# Patient Record
Sex: Male | Born: 1980 | Race: White | Hispanic: No | Marital: Married | Smoking: Former smoker
Health system: Southern US, Community
[De-identification: ages and names within clinical notes are randomized; demographics above are authoritative.]

---

## 2017-02-28 ENCOUNTER — Encounter: Payer: Self-pay | Admitting: Emergency Medicine

## 2017-02-28 ENCOUNTER — Emergency Department (INDEPENDENT_AMBULATORY_CARE_PROVIDER_SITE_OTHER): Payer: 59

## 2017-02-28 ENCOUNTER — Emergency Department (INDEPENDENT_AMBULATORY_CARE_PROVIDER_SITE_OTHER)
Admission: EM | Admit: 2017-02-28 | Discharge: 2017-02-28 | Disposition: A | Discharge status: Home / Self Care | Payer: 59 | Source: Home / Self Care | Attending: Family Medicine | Admitting: Family Medicine

## 2017-02-28 DIAGNOSIS — M7050 Other bursitis of knee, unspecified knee: Secondary | ICD-10-CM

## 2017-02-28 DIAGNOSIS — M222X1 Patellofemoral disorders, right knee: Secondary | ICD-10-CM | POA: Diagnosis not present

## 2017-02-28 DIAGNOSIS — M25461 Effusion, right knee: Secondary | ICD-10-CM | POA: Diagnosis not present

## 2017-02-28 MED ORDER — PREDNISONE 20 MG PO TABS: ORAL_TABLET | ORAL | 0 refills | Status: DC

## 2017-02-28 NOTE — ED Triage Notes (Signed)
Pt c/o right knee pain and swelling that started x2 weeks ago, denies injury but states yesterday pain worsened. He used ice and prednisone.

## 2017-02-28 NOTE — ED Provider Notes (Signed)
[facility]    CSN: [id number] Arrival date & time: 02/28/17  1230     History   Chief Complaint Chief Complaint  Patient presents with  . Knee Pain    HPI Anders SimmondsBenjamin J Koppenhaver is a 36 y.o. male.   Patient complains of persistent pain in his right knee for three weeks.  He is quite physically active but recalls no injury or change in physical activities.  He has been applying ice alternating with heat, doing stretching exercises, and took ibuprofen initially.  He then took a tapering course of prednisone with improvement.  No locking of knee or sensation of giving way.   The history is provided by the patient.  Knee Pain  Location:  Knee Time since incident:  3 weeks Injury: no   Knee location:  R knee Pain details:    Quality:  Aching   Radiates to:  Does not radiate   Severity:  Mild   Onset quality:  Sudden   Duration:  3 weeks   Timing:  Constant   Progression:  Improving Dislocation: no   Prior injury to area:  No Relieved by:  Nothing Worsened by:  Activity Ineffective treatments:  NSAIDs Associated symptoms: swelling   Associated symptoms: no back pain, no decreased ROM, no fatigue, no fever, no muscle weakness, no numbness, no stiffness and no tingling     History reviewed. No pertinent past medical history.  There are no active problems to display for this patient.   History reviewed. No pertinent surgical history.     Home Medications    Prior to Admission medications   Not on File    Family History History reviewed. No pertinent family history.  Social History Social History   Tobacco Use  . Smoking status: Never Smoker  . Smokeless tobacco: Never Used  Substance Use Topics  . Alcohol use: No    Frequency: Never  . Drug use: Not on file     Allergies   Patient has no allergy information on record.   Review of Systems Review of Systems  Constitutional: Negative for fatigue and fever.  Musculoskeletal: Negative  for back pain and stiffness.  All other systems reviewed and are negative.    Physical Exam Triage Vital Signs ED Triage Vitals  Enc Vitals Group     BP 02/28/17 1253 136/72     Pulse Rate 02/28/17 1253 66     Resp --      Temp 02/28/17 1253 97.8 F (36.6 C)     Temp Source 02/28/17 1253 Oral     SpO2 02/28/17 1253 98 %     Weight 02/28/17 1254 156 lb (70.8 kg)     Height --      Head Circumference --      Peak Flow --      Pain Score 02/28/17 1254 3     Pain Loc --      Pain Edu? --      Excl. in GC? --    No data found.  Updated Vital Signs BP 136/72 (BP Location: Right Arm)   Pulse 66   Temp 97.8 F (36.6 C) (Oral)   Wt 156 lb (70.8 kg)   SpO2 98%   Visual Acuity Right Eye Distance:   Left Eye Distance:   Bilateral Distance:    Right Eye Near:   Left Eye Near:    Bilateral Near:     Physical Exam  Constitutional: He appears well-developed and  well-nourished. No distress.  HENT:  Head: Normocephalic.  Eyes: Pupils are equal, round, and reactive to light.  Cardiovascular: Normal rate.  Pulmonary/Chest: Effort normal.  Musculoskeletal: He exhibits no edema.       Right knee: He exhibits normal range of motion, no swelling, no effusion, no ecchymosis, no deformity, no laceration, no erythema, normal alignment, no LCL laxity and normal meniscus. Tenderness found. No medial joint line and no lateral joint line tenderness noted.       Legs: Right knee:  No effusion, erythema, or warmth.  Knee stable, negative drawer test.  McMurray test negative.  Mild tenderness to palpation over pes anserine bursa.  Tenderness over inferior/medial edge of patella.   Neurological: He is alert.  Skin: Skin is warm and dry.  Nursing note and vitals reviewed.    UC Treatments / Results  Labs (all labs ordered are listed, but only abnormal results are displayed) Labs Reviewed - No data to display  EKG  EKG Interpretation None       Radiology Dg Knee Complete 4  Views Right  Result Date: 02/28/2017 CLINICAL DATA:  Pain for 3 weeks.  No injury is reported. EXAM: RIGHT KNEE - COMPLETE 4+ VIEW COMPARISON:  None. FINDINGS: No evidence of fracture, or dislocation. There is a small joint effusion. No evidence of arthropathy or other focal bone abnormality. Soft tissues are unremarkable. IMPRESSION: Small joint effusion. No fracture or significant joint space narrowing. Electronically Signed   By: Elsie StainJohn T Curnes M.D.   On: 02/28/2017 13:39    Procedures Procedures (including critical care time)  Medications Ordered in UC Medications - No data to display   Initial Impression / Assessment and Plan / UC Course  I have reviewed the triage vital signs and the nursing notes.  Pertinent labs & imaging results that were available during my care of the patient were reviewed by me and considered in my medical decision making (see chart for details).    Continue ice packs until pain and swelling decrease.  Continue range of motion, strengthening, and stretching exercises.  May begin Ibuprofen 200mg , 4 tabs every 8 hours with food, after finishing prednisone. Followup with Dr. Rodney Langtonhomas Thekkekandam or Dr. Clementeen GrahamEvan Corey ([facility]) if not improving about four weeks.    Final Clinical Impressions(s) / UC Diagnoses   Final diagnoses:  Patellofemoral pain syndrome of right knee  Pes anserine bursitis    ED Discharge Orders    None          Lattie HawBeese, Maronda Caison A, MD 02/28/17 1431

## 2017-02-28 NOTE — Discharge Instructions (Signed)
Continue ice packs until pain and swelling decrease.  Continue range of motion, strengthening, and stretching exercises.  May begin Ibuprofen 200mg , 4 tabs every 8 hours with food, after finishing prednisone.

## 2018-03-26 ENCOUNTER — Encounter: Payer: Self-pay | Admitting: *Deleted

## 2018-03-26 ENCOUNTER — Emergency Department
Admission: EM | Admit: 2018-03-26 | Discharge: 2018-03-26 | Disposition: A | Discharge status: Home / Self Care | Payer: No Typology Code available for payment source | Source: Home / Self Care | Attending: Family Medicine | Admitting: Family Medicine

## 2018-03-26 ENCOUNTER — Other Ambulatory Visit: Payer: Self-pay

## 2018-03-26 DIAGNOSIS — J101 Influenza due to other identified influenza virus with other respiratory manifestations: Secondary | ICD-10-CM | POA: Diagnosis not present

## 2018-03-26 LAB — POCT INFLUENZA A/B
Influenza A, POC: POSITIVE — AB
Influenza B, POC: NEGATIVE

## 2018-03-26 MED ORDER — OSELTAMIVIR PHOSPHATE 75 MG PO CAPS: 75.0000 mg | ORAL_CAPSULE | Freq: Two times a day (BID) | ORAL | 0 refills | Status: AC

## 2018-03-26 NOTE — Discharge Instructions (Signed)
  You may take 500mg acetaminophen every 4-6 hours or in combination with ibuprofen 400-600mg every 6-8 hours as needed for pain, inflammation, and fever.  Be sure to well hydrated with clear liquids and get at least 8 hours of sleep at night, preferably more while sick.   Please follow up with family medicine in 1 week if needed.   

## 2018-03-26 NOTE — ED Provider Notes (Signed)
[facility]    CSN: [id number] Arrival date & time: 03/26/18  0840     History   Chief Complaint Chief Complaint  Patient presents with  . Generalized Body Aches  . Fatigue  . Sinus Problem    HPI Marcus [facility] is a 38 y.o. male.   HPI  Marcus Fox is a 38 y.o. male presenting to UC with c/o 2 days body aches, fatigue, sinus congestion with pressure, and sore throat. Others at work have been sick. Pt concerned he has the flu.  He has taken sudafed, mucinex and ibuprofen w/o relief. No medication taken today. He did not receive the flu vaccine. He has been able to continue eating and drinking.    History reviewed. No pertinent past medical history.  There are no active problems to display for this patient.   History reviewed. No pertinent surgical history.     Home Medications    Prior to Admission medications   Medication Sig Start Date End Date Taking? Authorizing Provider  oseltamivir (TAMIFLU) 75 MG capsule Take 1 capsule (75 mg total) by mouth every 12 (twelve) hours. 03/26/18   Lurene Shadow, PA-C    Family History History reviewed. No pertinent family history.  Social History Social History   Tobacco Use  . Smoking status: Former Games developer  . Smokeless tobacco: Never Used  Substance Use Topics  . Alcohol use: Yes    Frequency: Never  . Drug use: Never     Allergies   Patient has no known allergies.   Review of Systems Review of Systems  Constitutional: Positive for chills and fatigue. Negative for fever.  HENT: Positive for congestion. Negative for ear pain, sore throat, trouble swallowing and voice change.   Respiratory: Positive for cough. Negative for shortness of breath.   Cardiovascular: Negative for chest pain and palpitations.  Gastrointestinal: Negative for abdominal pain, diarrhea, nausea and vomiting.  Musculoskeletal: Positive for arthralgias, back pain and myalgias.  Skin: Negative for rash.  Neurological:  Positive for headaches.     Physical Exam Triage Vital Signs ED Triage Vitals [03/26/18 0907]  Enc Vitals Group     BP 109/73     Pulse Rate 93     Resp 16     Temp 98.2 F (36.8 C)     Temp Source Oral     SpO2 98 %     Weight 160 lb (72.6 kg)     Height 5\' 11"  (1.803 m)     Head Circumference      Peak Flow      Pain Score 3     Pain Loc      Pain Edu?      Excl. in GC?    No data found.  Updated Vital Signs BP 109/73 (BP Location: Right Arm)   Pulse 93   Temp 98.2 F (36.8 C) (Oral)   Resp 16   Ht 5\' 11"  (1.803 m)   Wt 160 lb (72.6 kg)   SpO2 98%   BMI 22.32 kg/m   Visual Acuity Right Eye Distance:   Left Eye Distance:   Bilateral Distance:    Right Eye Near:   Left Eye Near:    Bilateral Near:     Physical Exam Vitals signs and nursing note reviewed.  Constitutional:      Appearance: Normal appearance. He is well-developed.  HENT:     Head: Normocephalic and atraumatic.     Right Ear: Tympanic membrane  normal.     Left Ear: Tympanic membrane normal.     Nose: Nose normal.     Right Sinus: No maxillary sinus tenderness or frontal sinus tenderness.     Left Sinus: No maxillary sinus tenderness or frontal sinus tenderness.     Mouth/Throat:     Lips: Pink.     Mouth: Mucous membranes are moist.     Pharynx: Oropharynx is clear. Uvula midline.  Neck:     Musculoskeletal: Normal range of motion.  Cardiovascular:     Rate and Rhythm: Normal rate and regular rhythm.  Pulmonary:     Effort: Pulmonary effort is normal. No respiratory distress.     Breath sounds: Normal breath sounds. No stridor. No wheezing or rhonchi.  Musculoskeletal: Normal range of motion.  Skin:    General: Skin is warm and dry.  Neurological:     Mental Status: He is alert and oriented to person, place, and time.  Psychiatric:        Behavior: Behavior normal.      UC Treatments / Results  Labs (all labs ordered are listed, but only abnormal results are  displayed) Labs Reviewed  POCT INFLUENZA A/B - Abnormal; Notable for the following components:      Result Value   Influenza A, POC Positive (*)    All other components within normal limits    EKG None  Radiology No results found.  Procedures Procedures (including critical care time)  Medications Ordered in UC Medications - No data to display  Initial Impression / Assessment and Plan / UC Course  I have reviewed the triage vital signs and the nursing notes.  Pertinent labs & imaging results that were available during my care of the patient were reviewed by me and considered in my medical decision making (see chart for details).     Flu test: POSITIVE Influenza A Rx: tamiflu  F/u with PCP as needed  Final Clinical Impressions(s) / UC Diagnoses   Final diagnoses:  Influenza A     Discharge Instructions      You may take 500mg  acetaminophen every 4-6 hours or in combination with ibuprofen 400-600mg  every 6-8 hours as needed for pain, inflammation, and fever.  Be sure to well hydrated with clear liquids and get at least 8 hours of sleep at night, preferably more while sick.   Please follow up with family medicine in 1 week if needed.     ED Prescriptions    Medication Sig Dispense Auth. Provider   oseltamivir (TAMIFLU) 75 MG capsule Take 1 capsule (75 mg total) by mouth every 12 (twelve) hours. 10 capsule Lurene Shadow, PA-C     Controlled Substance Prescriptions [place] Controlled Substance Registry consulted? Not Applicable   Rolla Plate 03/26/18 1058

## 2018-03-26 NOTE — ED Triage Notes (Signed)
Patient c/o 2 days of aches, fatigue, sinus pain and pressure. OTC-sudafed, mucinex and IBF.

## 2019-07-14 IMAGING — DX DG KNEE COMPLETE 4+V*R*
4 series · 4 of 4 positions shown · non-contrast
Comparison: None.

CLINICAL DATA: Pain for 3 weeks.  No injury is reported.

EXAM:
RIGHT KNEE - COMPLETE 4+ VIEW

[knee ap]
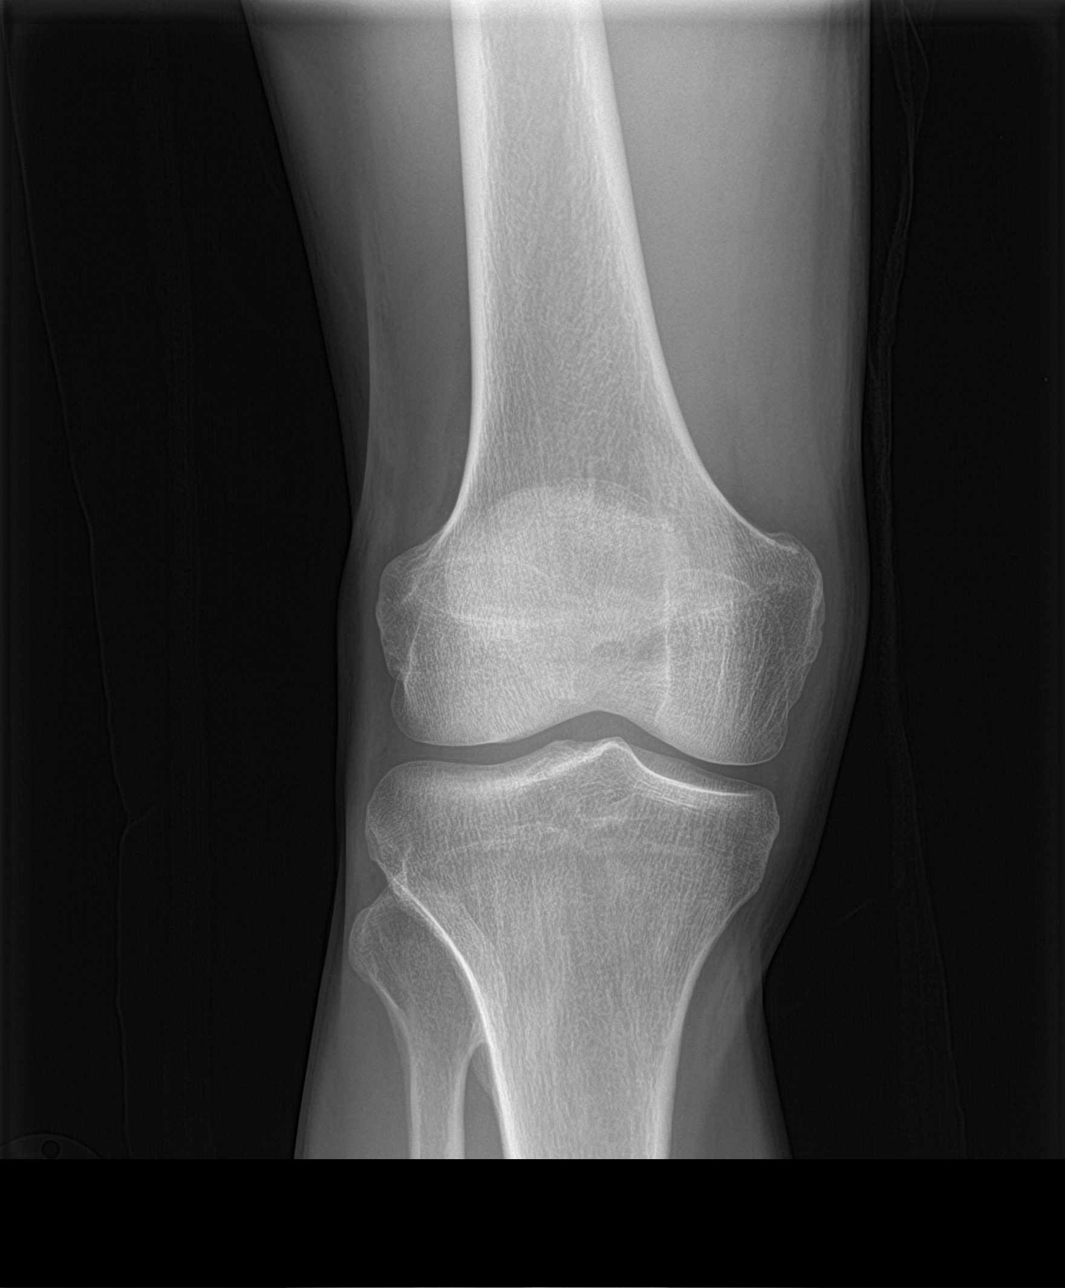

[knee lat]
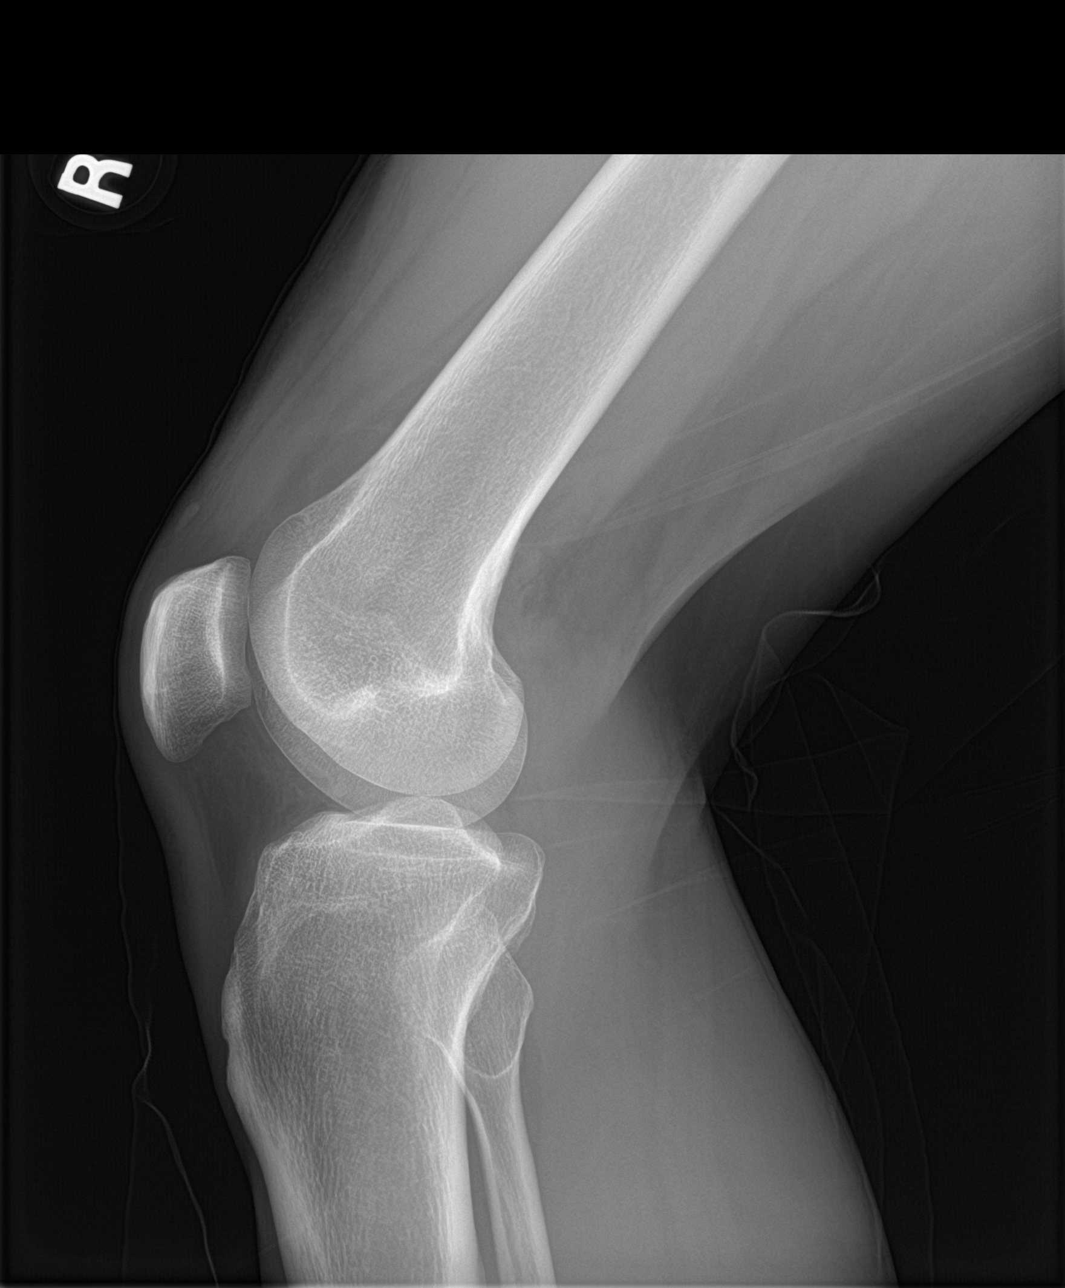

[knee obl (1 of 2)]
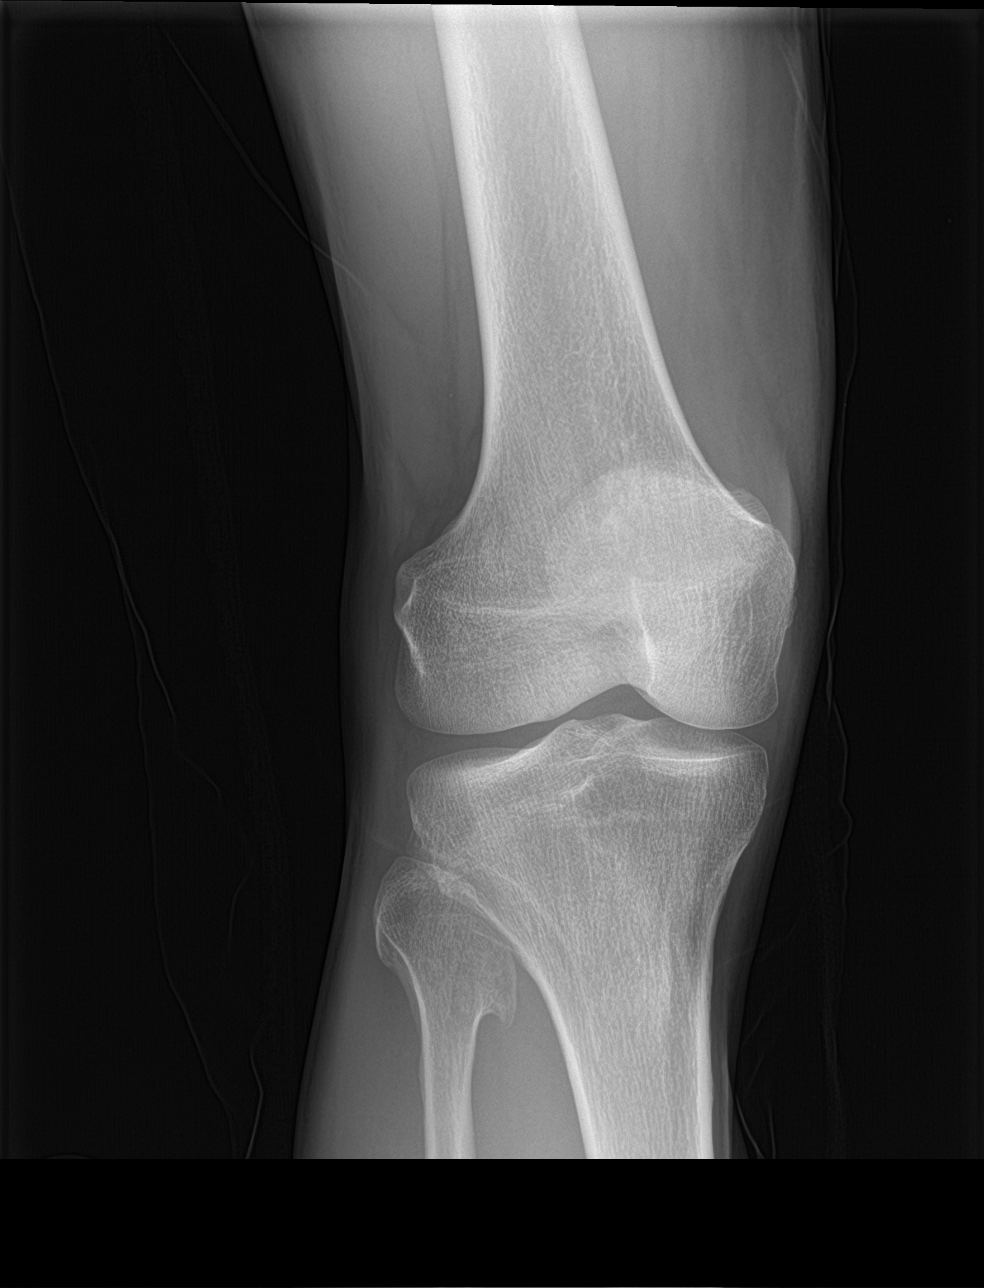

[knee obl (2 of 2)]
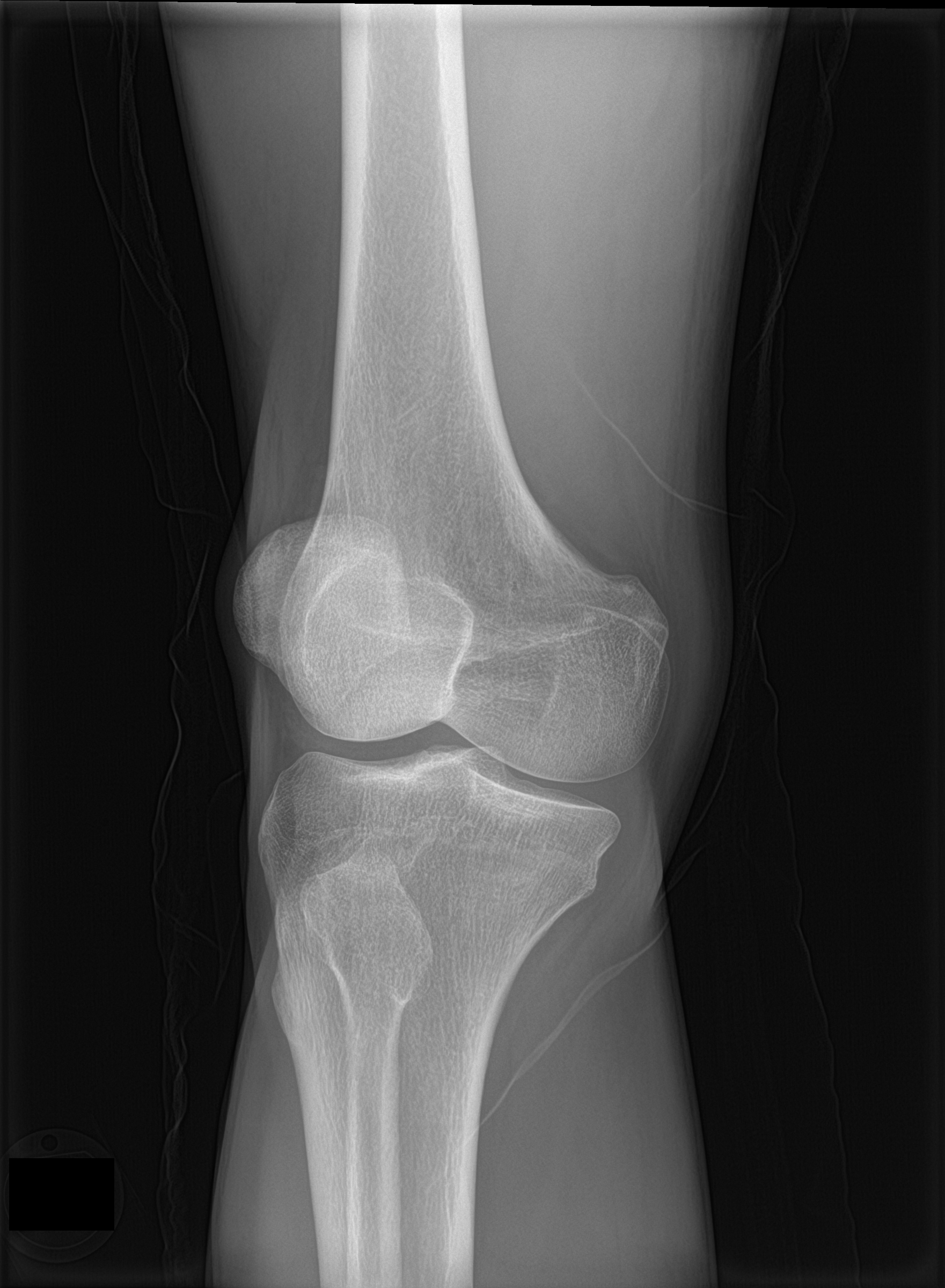

[4 of 4 positions shown; findings below may reference images not displayed]

FINDINGS: No evidence of fracture, or dislocation. There is a small joint
effusion. No evidence of arthropathy or other focal bone
abnormality. Soft tissues are unremarkable.
IMPRESSION: Small joint effusion. No fracture or significant joint space
narrowing.

## 2019-12-16 NOTE — Telephone Encounter (Signed)
Will obtain EKG from pcp

## 2019-12-17 ENCOUNTER — Ambulatory Visit
Admit: 2019-12-17 | Discharge: 2019-12-17 | Payer: PRIVATE HEALTH INSURANCE | Attending: Interventional Cardiology | Primary: Internal Medicine

## 2019-12-17 DIAGNOSIS — R9431 Abnormal electrocardiogram [ECG] [EKG]: Secondary | ICD-10-CM

## 2019-12-17 NOTE — Progress Notes (Signed)
[facility]  Cardiology New Patient Note    DATE of SERVICE:12/17/19  TIME of SERVICE: 3:16 PM                   Chief Complaint:  Chief Complaint   Patient presents with   ??? New Patient       History of PresentIllness:   Richard Copeland is a 39 y.o. male with no significant past medical history and no cardiac history who presented to his PCP for preoperative evaluation prior to bicep tendon repair surgery.  This has been scheduled for next week.  He had a preop EKG that showed some abnormalities and was referred for cardiac evaluation.  He reports no cardiac symptoms.  He denies chest pain, shortness of breath, dizziness, palpitations or syncope symptoms or presyncopal symptoms.  He is very physically active and does hiking, climbing, canoeing and reports no symptoms at all.  He has never had prior cardiac work-up.  We repeated an EKG today in the office which showed sinus rhythm and changes suggestive of early repolarization.  His blood pressure was 104/60 mmHg.  He has no family history of heart disease.  He is a former smoker and quit a year ago.    Past Medical History:  History reviewed. No pertinent past medical history.     Past Surgical History  Past Surgical History:   Procedure Laterality Date   ??? BICEPS TENODESIS         Family History  Family History   Problem Relation Age of Onset   ??? Elevated Lipids Mother         Social History  Social History     Tobacco Use   ??? Smoking status: Former Smoker   ??? Smokeless tobacco: Former Neurosurgeon     Types: Chew     Quit date: 12/08/2018   Substance Use Topics   ??? Alcohol use: Not on file     Comment: rare   ??? Drug use: Not on file        Allergies:  No Known Allergies    Medications:    Current Outpatient Medications:   ???  cetirizine (ZYRTEC) 10 MG tablet, Take 10 mg by mouth daily prn, Disp: , Rfl:     Review of Systems:  Review of Systems   Constitutional: Negative for chills, diaphoresis and fever.   HENT: Negative for nosebleeds.    Eyes:  Negative for visual disturbance.   Respiratory: Negative for cough, shortness of breath and wheezing.    Cardiovascular: Negative for chest pain, palpitations and leg swelling.   Gastrointestinal: Negative for abdominal pain, blood in stool, constipation, diarrhea, nausea and vomiting.   Genitourinary: Negative for hematuria.   Musculoskeletal: Negative for myalgias.   Skin: Negative for rash.   Neurological: Negative for dizziness and syncope.   Hematological: Does not bruise/bleed easily.   Psychiatric/Behavioral: Negative for dysphoric mood and suicidal ideas.        Denies Depression       Physical Examination:  Vitals:  Vitals:    12/17/19 1507   BP: 104/60   Site: Left Upper Arm   Position: Sitting   Cuff Size: Large Adult   Pulse: 60   Weight: 152 lb 9.6 oz (69.2 kg)   Height: 5\' 11"  (1.803 m)     Body mass index is 21.28 kg/m??.    Physical Exam  Constitutional:       Appearance: He is well-developed.   HENT:  Head: Normocephalic and atraumatic.   Eyes:      General: Lids are normal.      Conjunctiva/sclera: Conjunctivae normal.      Pupils: Pupils are equal, round, and reactive to light.   Neck:      Thyroid: No thyroid mass or thyromegaly.      Vascular: No carotid bruit or JVD.      Trachea: Trachea normal.   Cardiovascular:      Rate and Rhythm: Normal rate and regular rhythm.      Chest Wall: PMI is not displaced.      Pulses: No decreased pulses.           Dorsalis pedis pulses are 2+ on the right side and 2+ on the left side.        Posterior tibial pulses are 2+ on the right side and 2+ on the left side.      Heart sounds: Normal heart sounds. No murmur heard.   No systolic murmur is present.   No diastolic murmur is present.   No friction rub. No gallop.    Pulmonary:      Effort: Pulmonary effort is normal. No respiratory distress.      Breath sounds: Normal breath sounds. No stridor. No decreased breath sounds, wheezing, rhonchi or rales.   Chest:      Chest wall: No tenderness.   Abdominal:       General: Bowel sounds are normal. There is no distension.      Palpations: Abdomen is soft. There is no hepatomegaly or splenomegaly.      Tenderness: There is no abdominal tenderness.   Musculoskeletal:         General: No tenderness. Normal range of motion.      Cervical back: Normal range of motion and neck supple.   Skin:     General: Skin is warm and dry.      Capillary Refill: Capillary refill takes less than 2 seconds.      Findings: No rash.   Neurological:      Mental Status: He is alert and oriented to person, place, and time.   Psychiatric:         Behavior: Behavior normal.         Thought Content: Thought content normal.     ECG-sinus rhythm, changes of early repolarization.      Assessment and Plan:    Abnormal EKG-he was referred as an abnormal EKG.  This was reviewed and showed sinus rhythm with early repolarization which is a normal variant.  He has no cardiac symptoms.  He has no cardiac risk factors.  He has excellent functional capacity.  No further cardiac testing is indicated at this time.    Diet -Consume a healthy diet that emphasizes the intake of vegetables, fruits, nuts, whole grains, lean vegetable, and fish and minimizes the intake of trans fats, red meat and processed red meats, refined carbohydrates, and sweetened beverages.    Exercise- engage in at least 150 minutes per week of accumulated moderate-intensity physical activity or 75 minutes per week of vigorous-intensity physical activity.                                           Abstain from smoking/ second hand smoking/smokeless tobacco/ e -ciggarettes/vaping/alcohol/ recreational drug use.       He will follow up with  his PCP.    The assessment and plan was discussed with the patient verbalized understanding and agreement.

## 2020-03-01 NOTE — Other (Unsigned)
Patient Acct Nbr: [id number]   Primary AUTH/CERT:   Primary Insurance Company Name: [employer] Plan name: [employer]  Primary Insurance Group Number: [id number]  Primary Insurance Plan Type: Health  Primary Insurance Policy Number: [id number]    Secondary AUTH/CERT:   Secondary Insurance Company Name: [employer]  Secondary Insurance Plan name: [facility] SuperMed  Secondary Insurance Group Number: [id number]  Secondary Insurance Plan Type: Health  Secondary Insurance Policy Number: [id number]    Tertiary AUTH/CERT:   [employer] Name: [facility] Plan name: [facility]  [facility] Insurance Group Number: [id number]  [facility] Insurance Plan Type: Health  [employer] Policy Number: [id number]
